# Patient Record
Sex: Female | Born: 1995 | Race: Black or African American | Hispanic: No | Marital: Single | Smoking: Never smoker
Health system: Southern US, Community
[De-identification: ages and names within clinical notes are randomized; demographics above are authoritative.]

## PROBLEM LIST (undated history)

## (undated) DIAGNOSIS — N83209 Unspecified ovarian cyst, unspecified side: Secondary | ICD-10-CM

## (undated) HISTORY — PX: TONSILLECTOMY: SUR1361

---

## 2017-01-13 ENCOUNTER — Emergency Department (HOSPITAL_COMMUNITY)
Admission: EM | Admit: 2017-01-13 | Discharge: 2017-01-13 | Disposition: A | Discharge status: Home / Self Care | Payer: BLUE CROSS/BLUE SHIELD | Attending: Emergency Medicine | Admitting: Emergency Medicine

## 2017-01-13 ENCOUNTER — Emergency Department (HOSPITAL_COMMUNITY): Payer: BLUE CROSS/BLUE SHIELD

## 2017-01-13 ENCOUNTER — Encounter (HOSPITAL_COMMUNITY): Payer: Self-pay | Admitting: *Deleted

## 2017-01-13 DIAGNOSIS — N83201 Unspecified ovarian cyst, right side: Secondary | ICD-10-CM | POA: Diagnosis not present

## 2017-01-13 DIAGNOSIS — Z79899 Other long term (current) drug therapy: Secondary | ICD-10-CM | POA: Insufficient documentation

## 2017-01-13 DIAGNOSIS — R1031 Right lower quadrant pain: Secondary | ICD-10-CM | POA: Diagnosis present

## 2017-01-13 LAB — COMPREHENSIVE METABOLIC PANEL
ALBUMIN: 3.7 g/dL (ref 3.5–5.0)
ALK PHOS: 46 U/L (ref 38–126)
ALT: 18 U/L (ref 14–54)
AST: 18 U/L (ref 15–41)
Anion gap: 8 (ref 5–15)
CALCIUM: 9.2 mg/dL (ref 8.9–10.3)
CHLORIDE: 104 mmol/L (ref 101–111)
CO2: 23 mmol/L (ref 22–32)
CREATININE: 0.79 mg/dL (ref 0.44–1.00)
GFR calc non Af Amer: >60 mL/min (ref >60)
GLUCOSE: 97 mg/dL (ref 65–99)
Potassium: 4.1 mmol/L (ref 3.5–5.1)
SODIUM: 135 mmol/L (ref 135–145)
Total Bilirubin: 0.7 mg/dL (ref 0.3–1.2)
Total Protein: 6.7 g/dL (ref 6.5–8.1)

## 2017-01-13 LAB — CBC
HCT: 36.6 % (ref 36.0–46.0)
Hemoglobin: 12.3 g/dL (ref 12.0–15.0)
MCH: 28.5 pg (ref 26.0–34.0)
MCHC: 33.6 g/dL (ref 30.0–36.0)
MCV: 84.9 fL (ref 78.0–100.0)
PLATELETS: 268 10*3/uL (ref 150–400)
RBC: 4.31 MIL/uL (ref 3.87–5.11)
RDW: 13.7 % (ref 11.5–15.5)
WBC: 7.4 10*3/uL (ref 4.0–10.5)

## 2017-01-13 LAB — URINALYSIS, ROUTINE W REFLEX MICROSCOPIC
BILIRUBIN URINE: NEGATIVE
GLUCOSE, UA: NEGATIVE mg/dL
HGB URINE DIPSTICK: NEGATIVE
Ketones, ur: NEGATIVE mg/dL
Leukocytes, UA: NEGATIVE
Nitrite: NEGATIVE
PROTEIN: NEGATIVE mg/dL
Specific Gravity, Urine: 1.017 (ref 1.005–1.030)
pH: 6 (ref 5.0–8.0)

## 2017-01-13 LAB — POC URINE PREG, ED: Preg Test, Ur: NEGATIVE

## 2017-01-13 LAB — LIPASE, BLOOD: LIPASE: 16 U/L (ref 11–51)

## 2017-01-13 NOTE — ED Provider Notes (Signed)
[facility]-EMERGENCY DEPT Provider Note   CSN: [id number] Arrival date & time: 01/13/17  [id number]     History   Chief Complaint Chief Complaint  Patient presents with  . Abdominal Pain    HPI Anna Johnston is a 21 y.o. female diagnoses past medical history presents today for worsening right lower quadrant abdominal pain for one week. Patient reports pain mostly when she defecates that radiates to her back has been increasing for the past week. She states that the pain is about a 5/10 pain on defecation otherwise she does "not feel much of pain." She states that she thought her symptoms might be gas at first tried Gas-X with little to no relief. Patient reports going to the urgent care yesterday stating that she had a urinalysis done and they stated that her urine was "clean" but that she should come to the emergency department to investigate for appendicitis. Patient reports LMP 12/14/2017 and recently starting a new birth control. Patient denies any history of STI states her last time she had intercourse was 3 months ago. Patient denies fevers, chills, nausea, vomiting, vaginal pain, vaginal bleeding, vaginal discharge, history of STI, chest pain, shortness of breath, changes in bowel movements, no blood in stools, no constipation, changes in urinary symptoms, dysuria, frequency, or changes in appetite.  The history is provided by the patient. No language interpreter was used.    History reviewed. No pertinent past medical history.  There are no active problems to display for this patient.   Past Surgical History:  Procedure Laterality Date  . TONSILLECTOMY      OB History    No data available       Home Medications    Prior to Admission medications   Medication Sig Start Date End Date Taking? Authorizing Provider  bismuth subsalicylate (PEPTO BISMOL) 262 MG/15ML suspension Take 30 mLs by mouth every 6 (six) hours as needed for indigestion.   Yes Historical Provider, MD    ibuprofen (ADVIL,MOTRIN) 200 MG tablet Take 200 mg by mouth every 6 (six) hours as needed for mild pain.   Yes Historical Provider, MD  simethicone (MYLICON) 80 MG chewable tablet Chew 80 mg by mouth every 6 (six) hours as needed for flatulence.   Yes Historical Provider, MD  NIKKI 3-0.02 MG tablet Take 1 tablet by mouth daily. 12/22/16   Historical Provider, MD    Family History History reviewed. No pertinent family history.  Social History Social History  Substance Use Topics  . Smoking status: Never Smoker  . Smokeless tobacco: Never Used  . Alcohol use No     Allergies   Patient has no known allergies.   Review of Systems Review of Systems  Constitutional: Negative for appetite change, chills and fever.  HENT: Negative for trouble swallowing.   Respiratory: Negative for shortness of breath.   Cardiovascular: Negative for chest pain.  Gastrointestinal: Positive for abdominal pain. Negative for abdominal distention, constipation, diarrhea, nausea and vomiting.  Genitourinary: Negative for difficulty urinating, dysuria, vaginal bleeding, vaginal discharge and vaginal pain.  Musculoskeletal: Positive for back pain. Negative for neck pain and neck stiffness.  Skin: Negative for rash and wound.  Neurological: Negative for numbness.     Physical Exam Updated Vital Signs BP 104/92 (BP Location: Right Arm)   Pulse 85   Temp 98.8 F (37.1 C) (Oral)   Resp 18   Ht 5\' 2"  (1.575 m)   Wt 79.4 kg   LMP 12/14/2016   SpO2 100%  BMI 32.01 kg/m   Physical Exam  Constitutional: She is oriented to person, place, and time. She appears well-developed and well-nourished.  Well-appearing  HENT:  Head: Normocephalic and atraumatic.  Nose: Nose normal.  Eyes: Conjunctivae and EOM are normal. Pupils are equal, round, and reactive to light.  Neck: Normal range of motion. Neck supple.  Cardiovascular: Normal rate and normal heart sounds.   Pulmonary/Chest: Effort normal and breath  sounds normal. No respiratory distress. She exhibits no tenderness.  Abdominal: Soft. Bowel sounds are normal. There is no rebound and no guarding.  Mild tenderness to lower right quadrant. Maximal tenderness at the right suprapubic area  Musculoskeletal: Normal range of motion.  Neurological: She is alert and oriented to person, place, and time.  Skin: Skin is warm. Capillary refill takes less than 2 seconds.  Psychiatric: She has a normal mood and affect. Her behavior is normal.  Nursing note and vitals reviewed.    ED Treatments / Results  Labs (all labs ordered are listed, but only abnormal results are displayed) Labs Reviewed  COMPREHENSIVE METABOLIC PANEL - Abnormal; Notable for the following:       Result Value   BUN <5 (*)    All other components within normal limits  LIPASE, BLOOD  CBC  URINALYSIS, ROUTINE W REFLEX MICROSCOPIC  POC URINE PREG, ED    EKG  EKG Interpretation None       Radiology US Transvaginal Non-ob  Result Date: 01/13/2017 CLINICAL DATA:  Right lower quadrant pain for 1 week. EXAM: TRANSABDOMINAL AND TRANSVAGINAL ULTRASOUND OF PELVIS DOPPLER ULTRASOUND OF OVARIES TECHNIQUE: Both transabdominal and transvaginal ultrasound examinations of the pelvis were performed. Transabdominal technique was performed for global imaging of the pelvis including uterus, ovaries, adnexal regions, and pelvic cul-de-sac. It was necessary to proceed with endovaginal exam following the transabdominal exam to visualize the adnexal structures to an adequate degree. Color and duplex Doppler ultrasound was utilized to evaluate blood flow to the ovaries. COMPARISON:  None. FINDINGS: Uterus Measurements: 5.7 x 3.3 x 4 cm. No fibroids or other mass visualized. Endometrium Thickness: 5 mm.  No focal abnormality visualized. Right ovary Measurements: 5.9 x 5.2 x 5.9 cm. Contains a simple appearing cyst measuring 5.1 x 5.1 x 5 cm. Blood flow is shown within the surrounding ovarian  parenchyma. No mass or free fluid seen within the adjacent right adnexal region. Left ovary Measurements: 3 x 1.1 x 3 cm. Normal appearance/no adnexal mass. Pulsed Doppler evaluation of both ovaries demonstrates normal low-resistance arterial and venous waveforms. Other findings No abnormal free fluid. IMPRESSION: 1. Simple appearing cyst within the right ovary measuring 5.1 x 5 cm. Blood flow is shown within the surrounding right ovarian parenchyma. No mass or free fluid within the adjacent right adnexal region. Please be aware that at an ovarian cyst of this size may not resolve on its own and does increase risk for future ovarian torsion. Consider GYN consultation for follow-up and/or therapeutic considerations. 2. Uterus and left ovary appear normal. Electronically Signed   By: Bary Richard M.D.   On: 01/13/2017 14:56   US Pelvis Complete  Result Date: 01/13/2017 CLINICAL DATA:  Right lower quadrant pain for 1 week. EXAM: TRANSABDOMINAL AND TRANSVAGINAL ULTRASOUND OF PELVIS DOPPLER ULTRASOUND OF OVARIES TECHNIQUE: Both transabdominal and transvaginal ultrasound examinations of the pelvis were performed. Transabdominal technique was performed for global imaging of the pelvis including uterus, ovaries, adnexal regions, and pelvic cul-de-sac. It was necessary to proceed with endovaginal exam following the transabdominal  exam to visualize the adnexal structures to an adequate degree. Color and duplex Doppler ultrasound was utilized to evaluate blood flow to the ovaries. COMPARISON:  None. FINDINGS: Uterus Measurements: 5.7 x 3.3 x 4 cm. No fibroids or other mass visualized. Endometrium Thickness: 5 mm.  No focal abnormality visualized. Right ovary Measurements: 5.9 x 5.2 x 5.9 cm. Contains a simple appearing cyst measuring 5.1 x 5.1 x 5 cm. Blood flow is shown within the surrounding ovarian parenchyma. No mass or free fluid seen within the adjacent right adnexal region. Left ovary Measurements: 3 x 1.1 x 3 cm.  Normal appearance/no adnexal mass. Pulsed Doppler evaluation of both ovaries demonstrates normal low-resistance arterial and venous waveforms. Other findings No abnormal free fluid. IMPRESSION: 1. Simple appearing cyst within the right ovary measuring 5.1 x 5 cm. Blood flow is shown within the surrounding right ovarian parenchyma. No mass or free fluid within the adjacent right adnexal region. Please be aware that at an ovarian cyst of this size may not resolve on its own and does increase risk for future ovarian torsion. Consider GYN consultation for follow-up and/or therapeutic considerations. 2. Uterus and left ovary appear normal. Electronically Signed   By: Bary Richard M.D.   On: 01/13/2017 14:56   [place] Art/ven Flow Abd Pelv Doppler  Result Date: 01/13/2017 CLINICAL DATA:  Right lower quadrant pain for 1 week. EXAM: TRANSABDOMINAL AND TRANSVAGINAL ULTRASOUND OF PELVIS DOPPLER ULTRASOUND OF OVARIES TECHNIQUE: Both transabdominal and transvaginal ultrasound examinations of the pelvis were performed. Transabdominal technique was performed for global imaging of the pelvis including uterus, ovaries, adnexal regions, and pelvic cul-de-sac. It was necessary to proceed with endovaginal exam following the transabdominal exam to visualize the adnexal structures to an adequate degree. Color and duplex Doppler ultrasound was utilized to evaluate blood flow to the ovaries. COMPARISON:  None. FINDINGS: Uterus Measurements: 5.7 x 3.3 x 4 cm. No fibroids or other mass visualized. Endometrium Thickness: 5 mm.  No focal abnormality visualized. Right ovary Measurements: 5.9 x 5.2 x 5.9 cm. Contains a simple appearing cyst measuring 5.1 x 5.1 x 5 cm. Blood flow is shown within the surrounding ovarian parenchyma. No mass or free fluid seen within the adjacent right adnexal region. Left ovary Measurements: 3 x 1.1 x 3 cm. Normal appearance/no adnexal mass. Pulsed Doppler evaluation of both ovaries demonstrates normal  low-resistance arterial and venous waveforms. Other findings No abnormal free fluid. IMPRESSION: 1. Simple appearing cyst within the right ovary measuring 5.1 x 5 cm. Blood flow is shown within the surrounding right ovarian parenchyma. No mass or free fluid within the adjacent right adnexal region. Please be aware that at an ovarian cyst of this size may not resolve on its own and does increase risk for future ovarian torsion. Consider GYN consultation for follow-up and/or therapeutic considerations. 2. Uterus and left ovary appear normal. Electronically Signed   By: Bary Richard M.D.   On: 01/13/2017 14:56    Procedures Procedures (including critical care time)  Medications Ordered in ED Medications - No data to display   Initial Impression / Assessment and Plan / ED Course  I have reviewed the triage vital signs and the nursing notes.  Pertinent labs & imaging results that were available during my care of the patient were reviewed by me and considered in my medical decision making (see chart for details).  Clinical Course   Pt is a 21 yo female with no significant PMhx who presents with RLQ pain x 1 week.  On exam, pt in NAD. Nontoxic/nonseptic appearing. VSS. Afebrile. Lungs CTA. Heart RRR. Abdomen soft and mildly tender at [facility], maximally tender at right suprapubic area. Labs and UA are reassuring. Imaging shows new right ovarian cyst 5.1 x 5cm. Plan is to discharge home with strict instructions to follow up today or tomorrow with her gynecologist regarding today's findings. Pt advised that the size of her ovarian cyst makes her a higher risk of ovarian torsion. Pt understood urgency for follow up to GYN. At time of discharge, Patient is in no acute distress. Vital Signs are stable. Patient is able to ambulate. Return precautions given for any new or worsening symptoms such as severe pain in RLQ, fever, chills.    Final Clinical Impressions(s) / ED Diagnoses   Final diagnoses:  Cyst of right  ovary    New Prescriptions Discharge Medication List as of 01/13/2017  3:23 PM       [address] [place], [place] 01/13/17 1957    Laurence Spates, MD 01/25/17 [phone]

## 2017-01-13 NOTE — ED Notes (Signed)
Patient transported to us 

## 2017-01-13 NOTE — ED Notes (Signed)
Returned from Us.

## 2017-01-13 NOTE — Discharge Instructions (Signed)
Please schedule appointment with your gynecologist today or tomorrow for follow up regarding today's results.  Simple appearing cyst within the right ovary measuring 5.1 x 5  cm. Blood flow is shown within the surrounding right ovarian  parenchyma. No mass or free fluid within the adjacent right adnexal  region. Please be aware that at an ovarian cyst of this size may not  resolve on its own and does increase risk for future ovarian  torsion. Consider GYN consultation for follow-up and/or therapeutic  considerations.   Get help right away if: You have abdominal pain that is severe or gets worse. You cannot eat or drink without vomiting. You suddenly develop a fever. Your menstrual period is much heavier than usual.

## 2017-01-13 NOTE — ED Notes (Signed)
Patient aware she is waiting on u/s

## 2017-10-06 ENCOUNTER — Emergency Department (HOSPITAL_COMMUNITY)
Admission: EM | Admit: 2017-10-06 | Discharge: 2017-10-06 | Disposition: A | Discharge status: Home / Self Care | Payer: BLUE CROSS/BLUE SHIELD | Attending: Emergency Medicine | Admitting: Emergency Medicine

## 2017-10-06 ENCOUNTER — Encounter (HOSPITAL_COMMUNITY): Payer: Self-pay

## 2017-10-06 ENCOUNTER — Emergency Department (HOSPITAL_COMMUNITY): Payer: BLUE CROSS/BLUE SHIELD

## 2017-10-06 DIAGNOSIS — Z79899 Other long term (current) drug therapy: Secondary | ICD-10-CM | POA: Diagnosis not present

## 2017-10-06 DIAGNOSIS — X500XXA Overexertion from strenuous movement or load, initial encounter: Secondary | ICD-10-CM | POA: Diagnosis not present

## 2017-10-06 DIAGNOSIS — Y999 Unspecified external cause status: Secondary | ICD-10-CM | POA: Diagnosis not present

## 2017-10-06 DIAGNOSIS — Y9389 Activity, other specified: Secondary | ICD-10-CM | POA: Diagnosis not present

## 2017-10-06 DIAGNOSIS — Y929 Unspecified place or not applicable: Secondary | ICD-10-CM | POA: Diagnosis not present

## 2017-10-06 DIAGNOSIS — S46911A Strain of unspecified muscle, fascia and tendon at shoulder and upper arm level, right arm, initial encounter: Secondary | ICD-10-CM

## 2017-10-06 DIAGNOSIS — S4991XA Unspecified injury of right shoulder and upper arm, initial encounter: Secondary | ICD-10-CM | POA: Diagnosis present

## 2017-10-06 HISTORY — DX: Unspecified ovarian cyst, unspecified side: N83.209

## 2017-10-06 MED ORDER — CYCLOBENZAPRINE HCL 10 MG PO TABS: ORAL_TABLET | ORAL | 0 refills | Status: AC

## 2017-10-06 MED ORDER — IBUPROFEN 600 MG PO TABS: 600.0000 mg | ORAL_TABLET | Freq: Four times a day (QID) | ORAL | 0 refills | Status: AC

## 2017-10-06 NOTE — ED Provider Notes (Signed)
[facility]-EMERGENCY DEPT Provider Note   CSN: [id number] Arrival date & time: 10/06/17  1430     History   Chief Complaint Chief Complaint  Patient presents with  . Shoulder Pain    HPI Johnston Anna is a 21 y.o. female.  The history is provided by the patient.  Shoulder Pain   This is a new problem. The current episode started more than 1 week ago. The problem occurs daily. The problem has been gradually worsening. The pain is present in the right shoulder. The quality of the pain is described as sharp and intermittent. The pain is moderate. Pertinent negatives include no numbness and no tingling. Exacerbated by: movement and lifting. She has tried OTC pain medications for the symptoms. The treatment provided mild relief.    Past Medical History:  Diagnosis Date  . Ovarian cyst     There are no active problems to display for this patient.   Past Surgical History:  Procedure Laterality Date  . TONSILLECTOMY      OB History    No data available       Home Medications    Prior to Admission medications   Medication Sig Start Date End Date Taking? Authorizing Provider  bismuth subsalicylate (PEPTO BISMOL) 262 MG/15ML suspension Take 30 mLs by mouth every 6 (six) hours as needed for indigestion.    [provider]  ibuprofen (ADVIL,MOTRIN) 200 MG tablet Take 200 mg by mouth every 6 (six) hours as needed for mild pain.    [provider]  Anna Johnston 3-0.02 MG tablet Take 1 tablet by mouth daily. 12/22/16   [provider]  simethicone (MYLICON) 80 MG chewable tablet Chew 80 mg by mouth every 6 (six) hours as needed for flatulence.    [provider]    Family History No family history on file.  Social History Social History  Substance Use Topics  . Smoking status: Never Smoker  . Smokeless tobacco: Never Used  . Alcohol use No     Allergies   Patient has no known allergies.   Review of Systems Review of Systems    Constitutional: Negative for activity change.       All ROS Neg except as noted in HPI  HENT: Negative for nosebleeds.   Eyes: Negative for photophobia and discharge.  Respiratory: Negative for cough, shortness of breath and wheezing.   Cardiovascular: Negative for chest pain and palpitations.  Gastrointestinal: Negative for abdominal pain and blood in stool.  Genitourinary: Negative for dysuria, frequency and hematuria.  Musculoskeletal: Positive for arthralgias. Negative for back pain and neck pain.  Skin: Negative.   Neurological: Negative for dizziness, tingling, seizures, speech difficulty and numbness.  Psychiatric/Behavioral: Negative for confusion and hallucinations.     Physical Exam Updated Vital Signs BP (!) 143/96 (BP Location: Right Arm)   Pulse 95   Temp 98.3 F (36.8 C) (Oral)   Resp 18   Ht  (1.575 m)   Wt 80.3 kg (177 lb)   LMP 10/01/2017   SpO2 97%   BMI 32.37 kg/m   Physical Exam  Constitutional: She is oriented to person, place, and time. She appears well-developed and well-nourished.  Non-toxic appearance.  HENT:  Head: Normocephalic.  Right Ear: Tympanic membrane and external ear normal.  Left Ear: Tympanic membrane and external ear normal.  Eyes: Pupils are equal, round, and reactive to light. EOM and lids are normal.  Neck: Normal range of motion. Neck supple. Carotid bruit is not present.  Cardiovascular: Normal rate, regular rhythm, normal heart sounds, intact distal pulses and normal pulses.   Pulmonary/Chest: Breath sounds normal. No respiratory distress.  Abdominal: Soft. Bowel sounds are normal. There is no tenderness. There is no guarding.  Musculoskeletal: Normal range of motion.       Right shoulder: She exhibits pain and spasm. She exhibits normal range of motion.       Arms: Lymphadenopathy:       Head (right side): No submandibular adenopathy present.       Head (left side): No submandibular adenopathy present.    She has no  cervical adenopathy.  Neurological: She is alert and oriented to person, place, and time. She has normal strength. No cranial nerve deficit or sensory deficit.  Skin: Skin is warm and dry.  Psychiatric: She has a normal mood and affect. Her speech is normal.  Nursing note and vitals reviewed.    ED Treatments / Results  Labs (all labs ordered are listed, but only abnormal results are displayed) Labs Reviewed - No data to display  EKG  EKG Interpretation None       Radiology Dg Shoulder Right  Result Date: 10/06/2017 CLINICAL DATA:  21 year old female with history of right shoulder pain for the past 2 weeks. EXAM: RIGHT SHOULDER - 2+ VIEW COMPARISON:  No priors. FINDINGS: There is no evidence of fracture or dislocation. There is no evidence of arthropathy or other focal bone abnormality. Soft tissues are unremarkable. IMPRESSION: Negative. Electronically Signed   By: Trudie Reed M.D.   On: 10/06/2017 14:59    Procedures Procedures (including critical care time)  Medications Ordered in ED Medications - No data to display   Initial Impression / Assessment and Plan / ED Course  I have reviewed the triage vital signs and the nursing notes.  Pertinent labs & imaging results that were available during my care of the patient were reviewed by me and considered in my medical decision making (see chart for details).       Final Clinical Impressions(s) / ED Diagnoses MDM Vital signs reviewed. There no gross neurovascular deficits appreciated of the upper extremities. X-ray is negative for fracture or dislocation. I suspect the patient has a muscle strain involving the right shoulder area. I've asked the patient use a heating pad. Prescription given for Flexeril to be using at bedtime. Patient will use ibuprofen with breakfast, lunch, dinner, and at bedtime. Patient will return to the emergency department if any changes, problems, or concerns.   Final diagnoses:  Strain of  right shoulder, initial encounter    New Prescriptions New Prescriptions   No medications on file     Ivery Quale, Cordelia Poche 10/06/17 1539    Benjiman Core, MD 10/06/17 [phone]

## 2017-10-06 NOTE — ED Notes (Signed)
Pt alert & oriented x4, stable gait. Patient given discharge instructions, paperwork & prescription(s). Patient  instructed to stop at the registration desk to finish any additional paperwork. Patient verbalized understanding. Pt left department w/ no further questions. 

## 2017-10-06 NOTE — ED Notes (Signed)
Pain in right shoulder for the past 2 weeks. Pt states started after moving a sofa. No deformity noted. Pt states pain when she raises her arm & when she moves it across her body. Pt has good pulses present & sensation.

## 2017-10-06 NOTE — ED Triage Notes (Signed)
Pt reports r shoulder pain x 2 weeks.  Pt says pain started after moving a couch.

## 2017-10-06 NOTE — Discharge Instructions (Signed)
Your x-ray is negative for fracture or dislocation. There no gross neurologic or vascular deficits appreciated on your examination at this time. Your examination favors muscle strain. Please rest your shoulder is much as possible. Use a heating pad at rest and at bedtime. Use ibuprofen with breakfast, lunch, dinner, and at bedtime. Use Flexeril at bedtime for spasm pain. This medication may cause drowsiness, please use it with caution.

## 2018-12-01 IMAGING — US US PELVIS COMPLETE
1 series · 13 of 25 positions shown · non-contrast
Comparison: None.

CLINICAL DATA: Right lower quadrant pain for 1 week.

EXAM:
TRANSABDOMINAL AND TRANSVAGINAL ULTRASOUND OF PELVIS
DOPPLER ULTRASOUND OF OVARIES
TECHNIQUE: Both transabdominal and transvaginal ultrasound examinations of the
pelvis were performed. Transabdominal technique was performed for
global imaging of the pelvis including uterus, ovaries, adnexal
regions, and pelvic cul-de-sac.
It was necessary to proceed with endovaginal exam following the
transabdominal exam to visualize the adnexal structures to an
adequate degree. Color and duplex Doppler ultrasound was utilized to
evaluate blood flow to the ovaries.

[Series 1: us pelvis complete · 0.21mm/px · 13 of 67 slices shown]
[im 1/67]
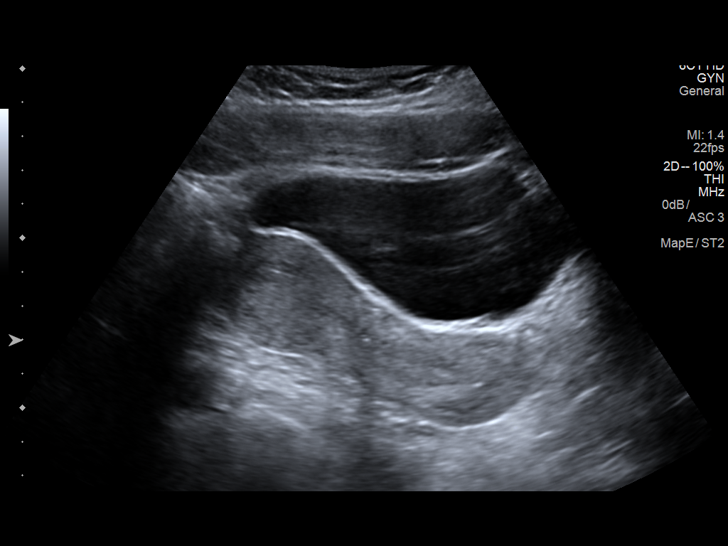
[im 6/67]
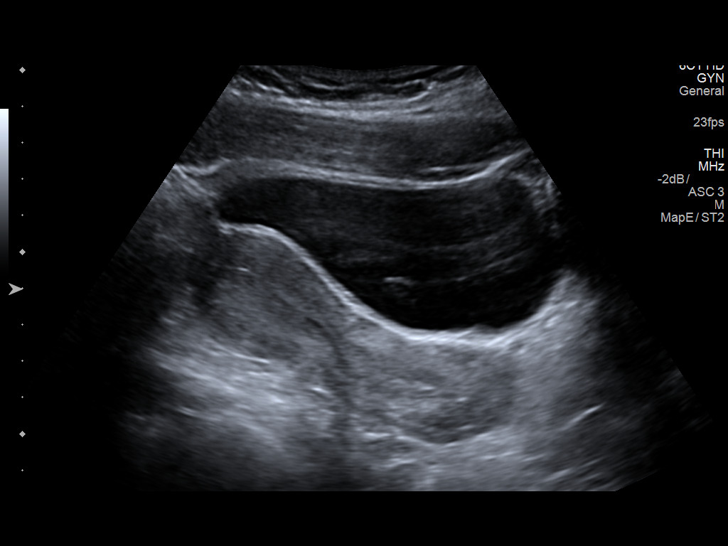
[im 12/67]
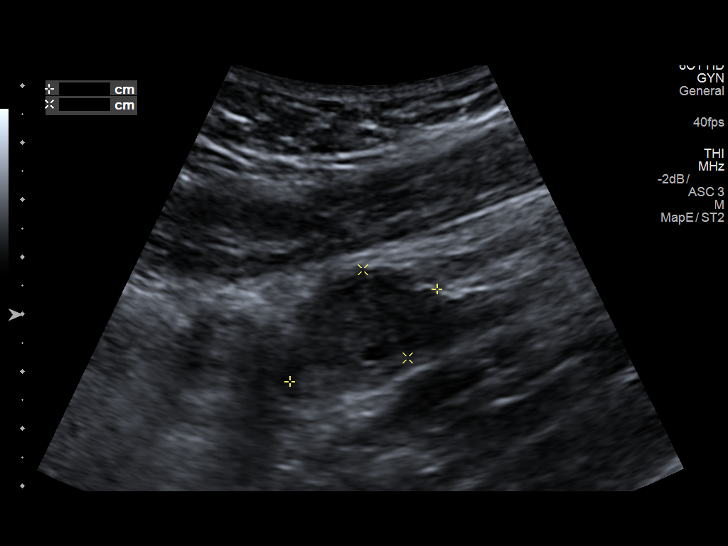
[im 17/67]
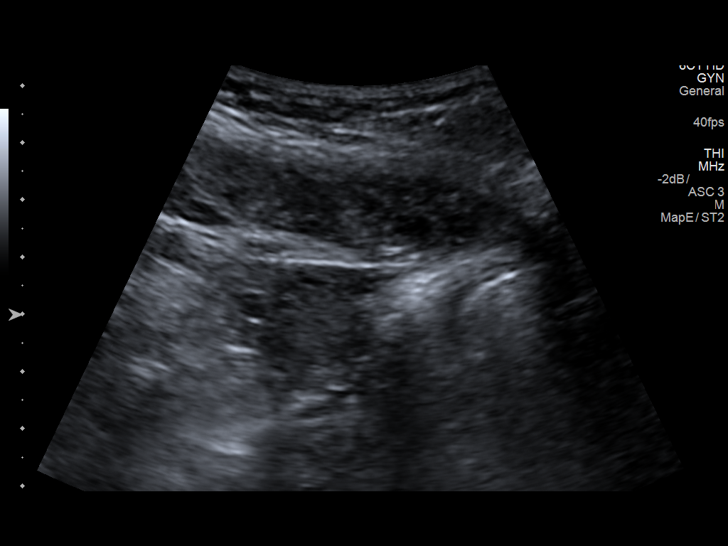
[im 23/67]
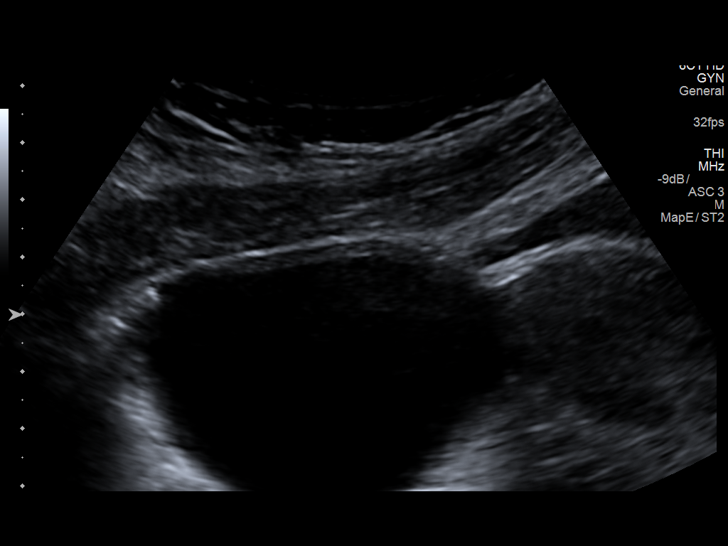
[im 28/67]
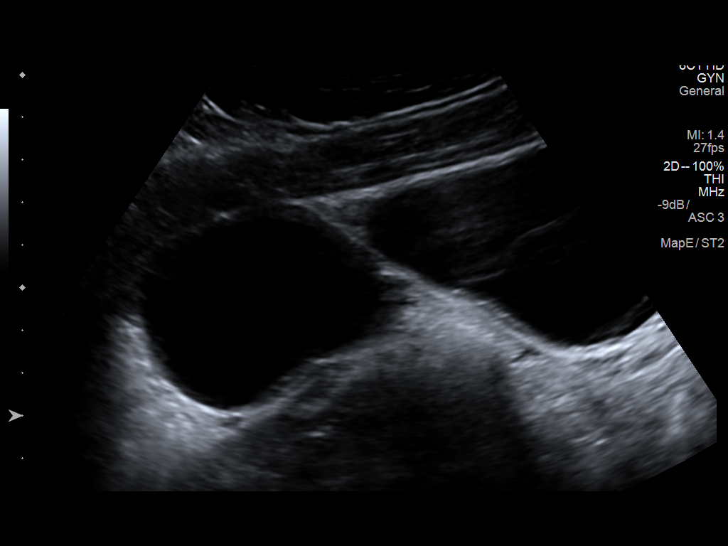
[im 34/67]
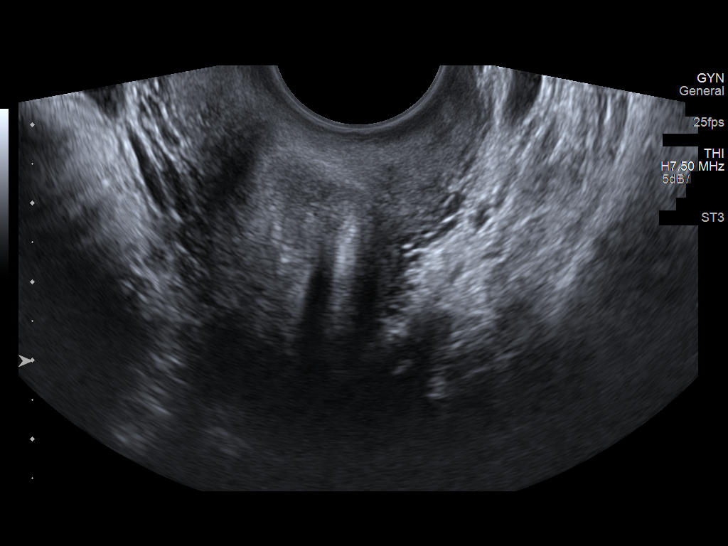
[im 39/67]
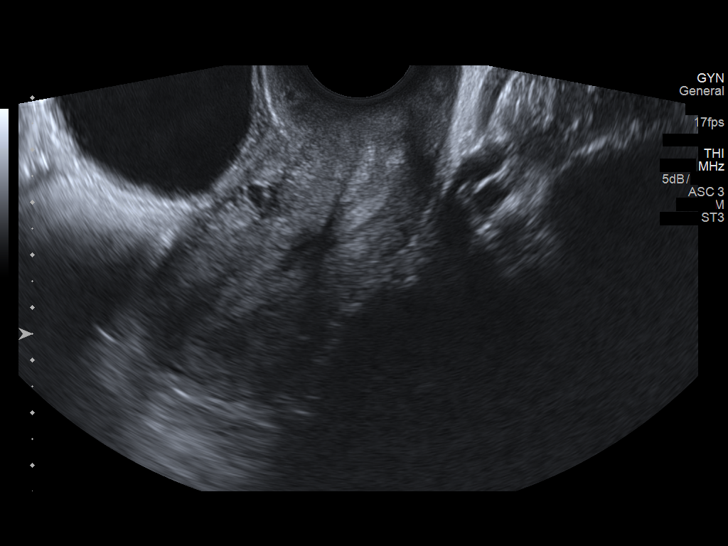
[im 45/67]
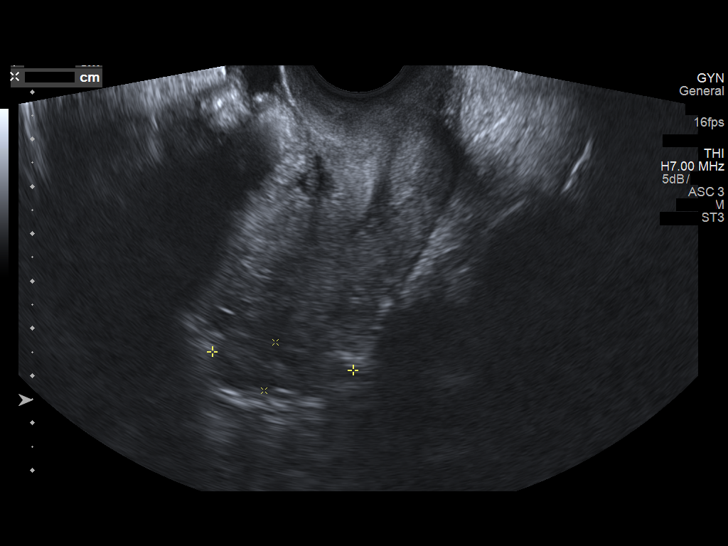
[im 50/67]
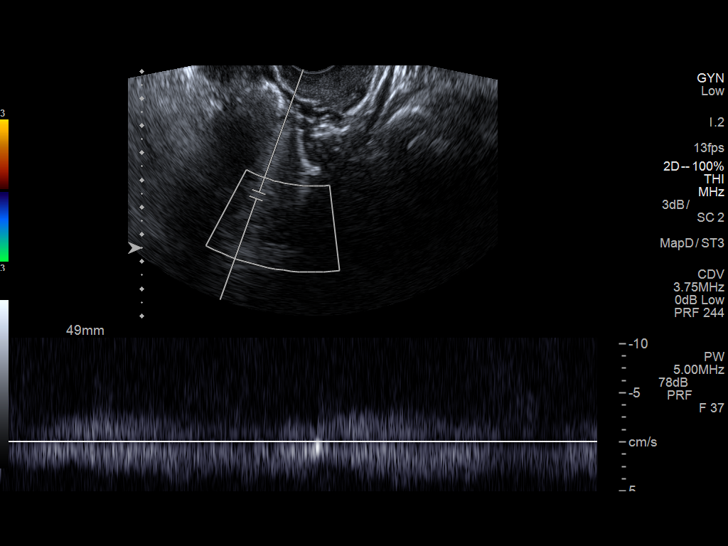
[im 56/67]
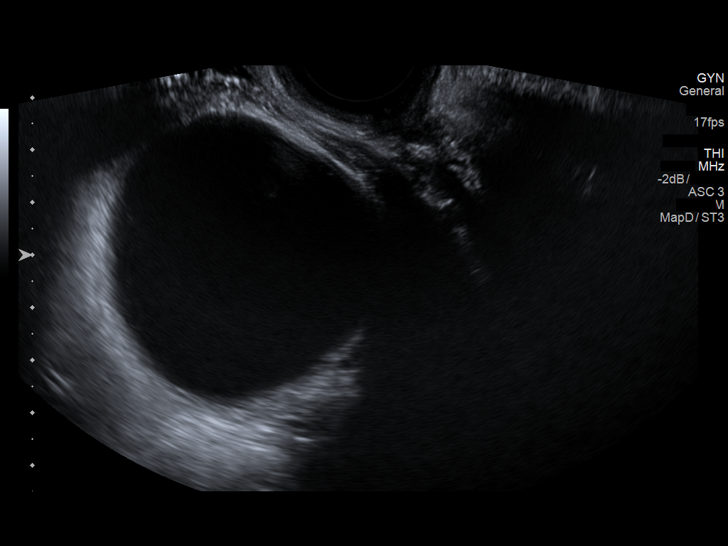
[im 61/67]
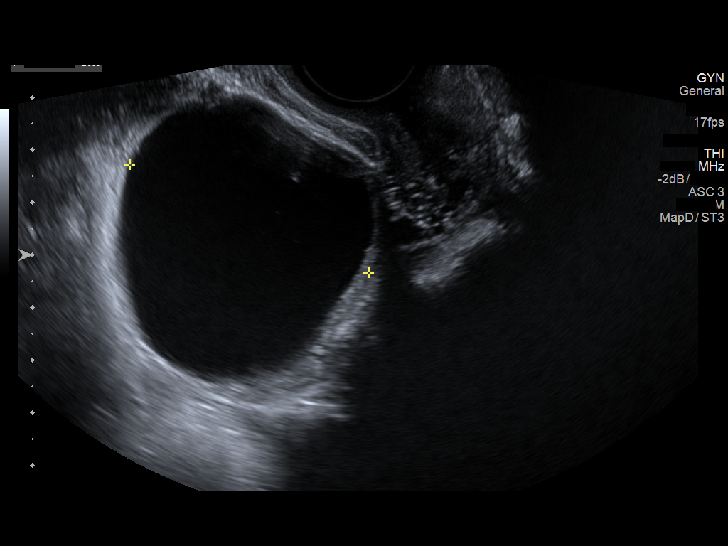
[im 67/67]
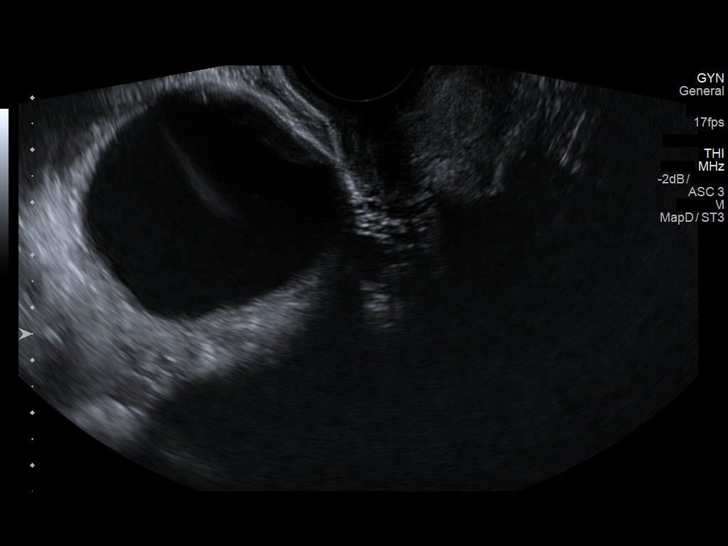

[13 of 25 positions shown; findings below may reference images not displayed]

FINDINGS: Uterus

Measurements: 5.7 x 3.3 x 4 cm. No fibroids or other mass
visualized.

Endometrium

Thickness: 5 mm.  No focal abnormality visualized.

Right ovary

Measurements: 5.9 x 5.2 x 5.9 cm.. Contains a simple appearing cyst
measuring 5.1 x 5.1 x 5 cm. Blood flow is shown within the
surrounding ovarian parenchyma. No mass or free fluid seen within
the adjacent right adnexal region.

Left ovary

Measurements: 3 x 1.1 x 3 cm. Normal appearance/no adnexal mass.

Pulsed Doppler evaluation of both ovaries demonstrates normal
low-resistance arterial and venous waveforms.

Other findings

No abnormal free fluid.
IMPRESSION: 1. Simple appearing cyst within the right ovary measuring 5.1 x 5
cm. Blood flow is shown within the surrounding right ovarian
parenchyma. No mass or free fluid within the adjacent right adnexal
region. Please be aware that at an ovarian cyst of this size may not
resolve on its own and does increase risk for future ovarian
torsion. Consider GYN consultation for follow-up and/or therapeutic
considerations.
2. Uterus and left ovary appear normal.

## 2019-03-08 IMAGING — DX DG SHOULDER 2+V*R*
3 series · 3 of 3 positions shown · non-contrast
Comparison: No priors.

CLINICAL DATA: 20-year-old female with history of right shoulder
pain for the past 2 weeks.

EXAM:
RIGHT SHOULDER - 2+ VIEW

[shoulder grashey]
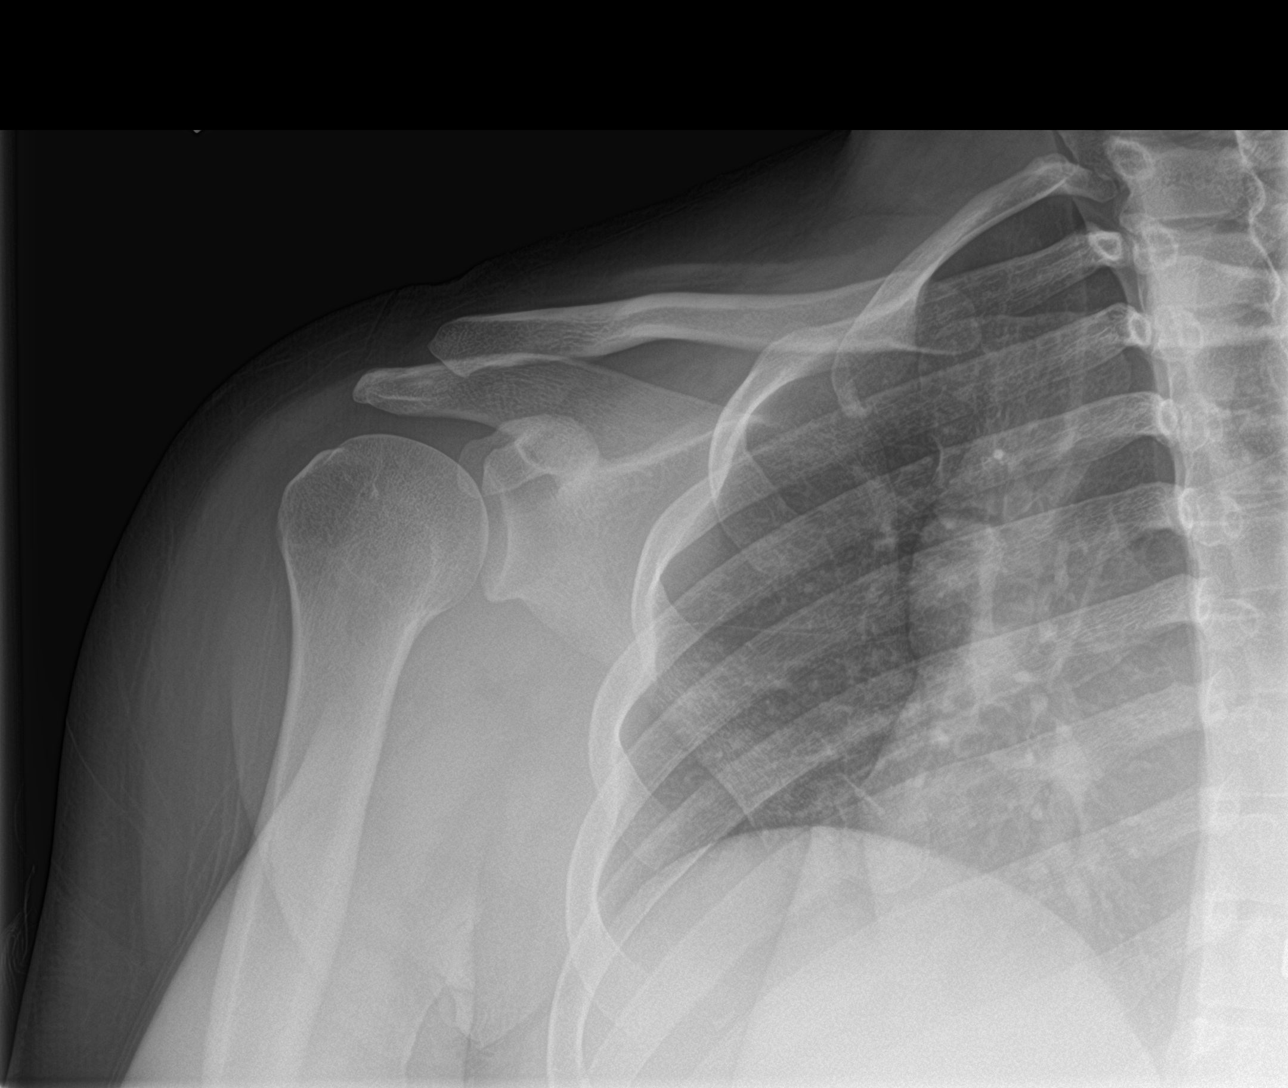

[shoulder y view]
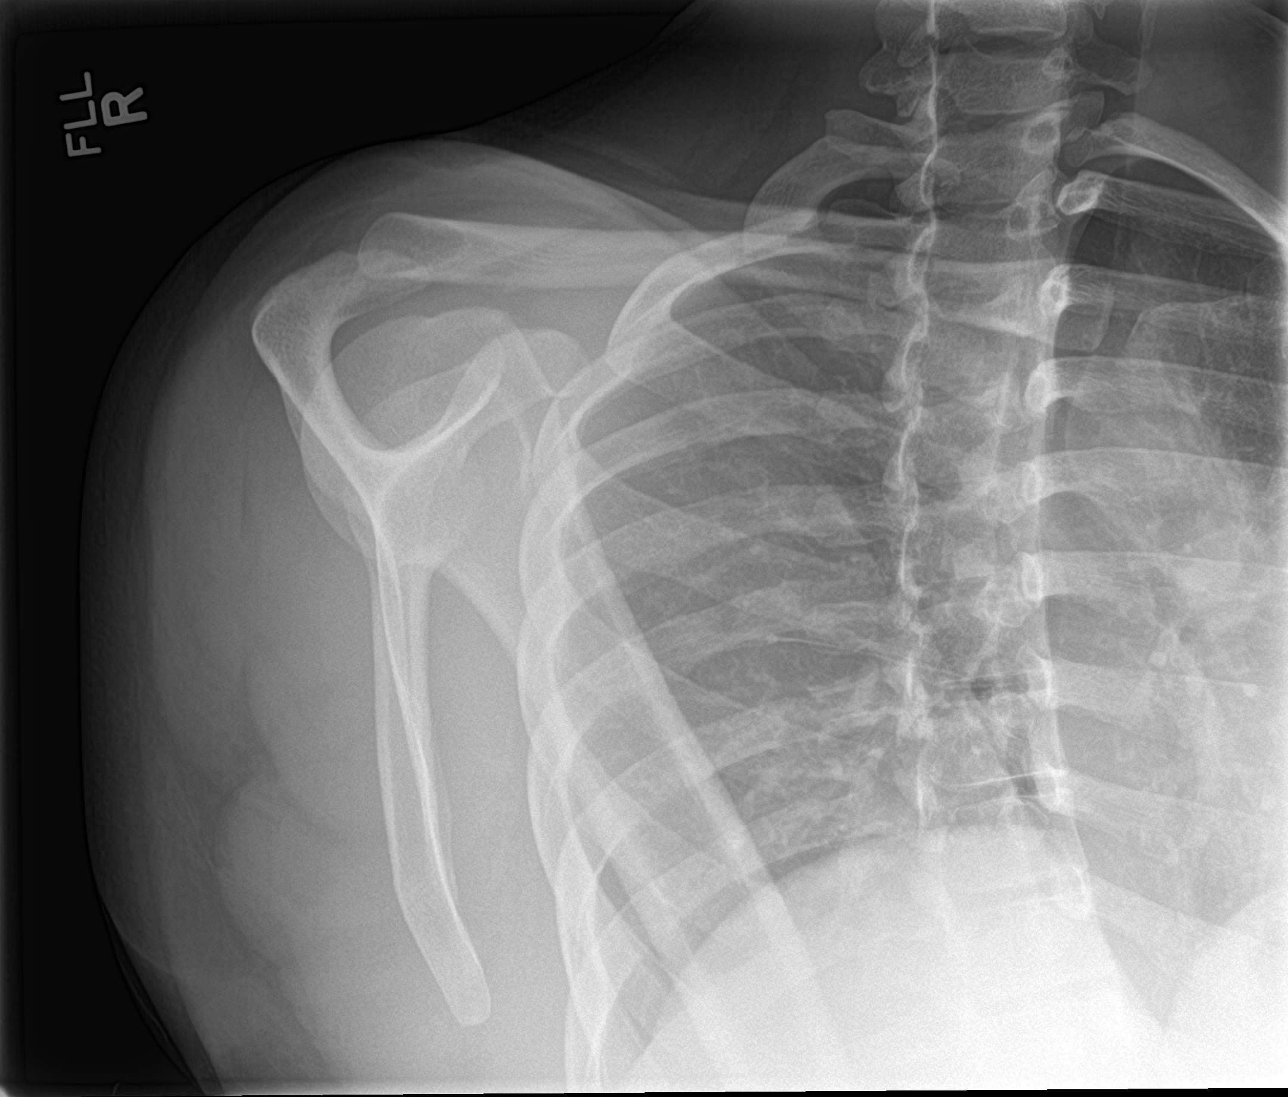

[shoulder axillary]
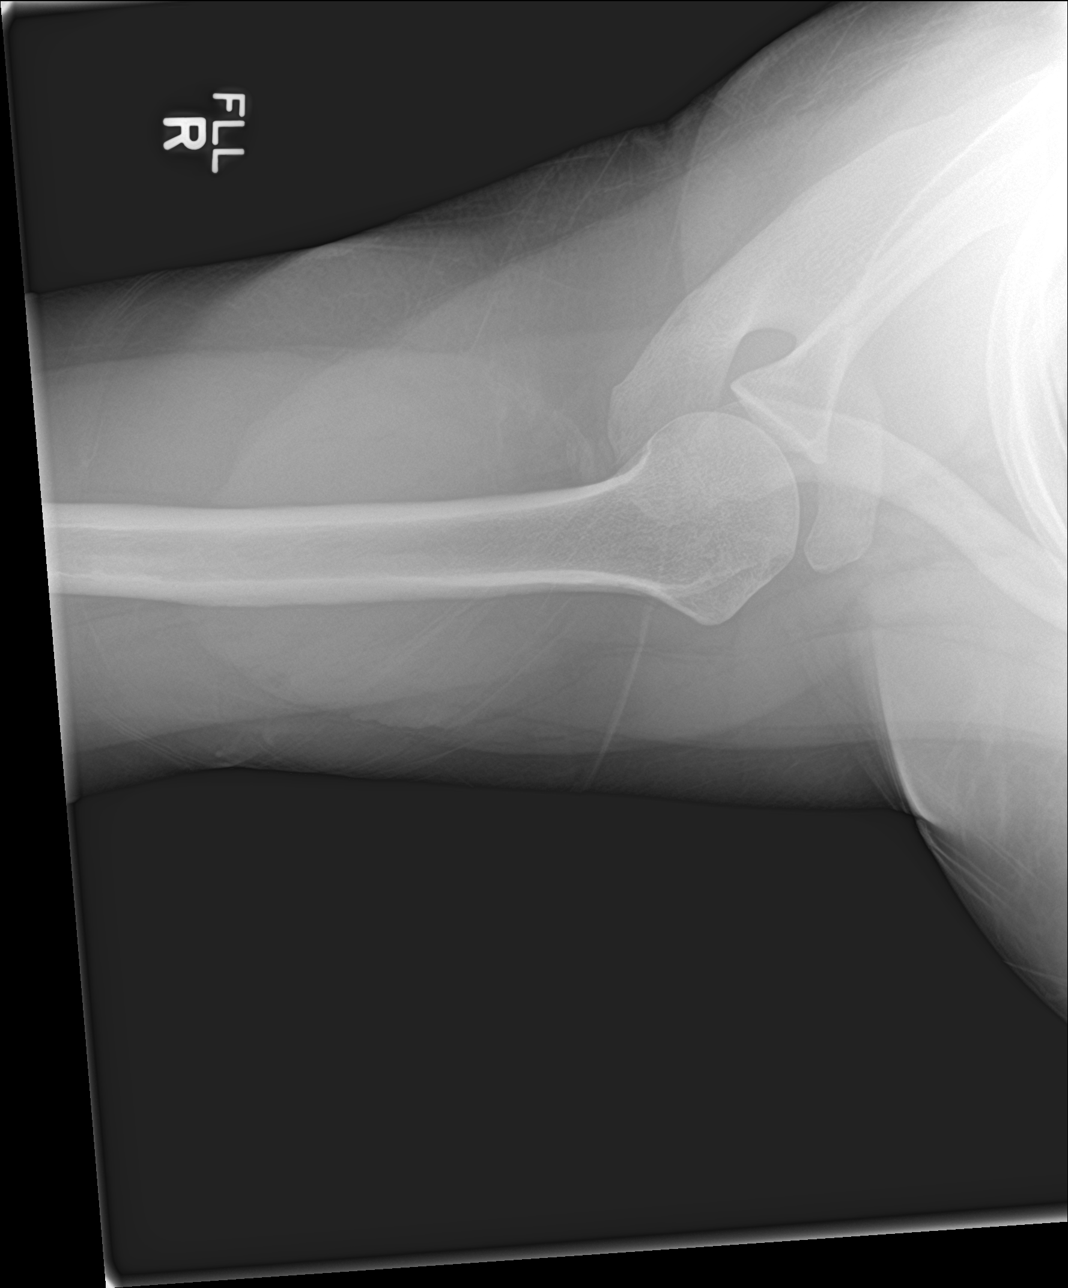

[3 of 3 positions shown; findings below may reference images not displayed]

FINDINGS: There is no evidence of fracture or dislocation. There is no
evidence of arthropathy or other focal bone abnormality. Soft
tissues are unremarkable.
IMPRESSION: Negative.

## 2020-02-25 ENCOUNTER — Ambulatory Visit: Payer: BLUE CROSS/BLUE SHIELD | Attending: Internal Medicine

## 2020-02-25 ENCOUNTER — Other Ambulatory Visit: Payer: Self-pay

## 2020-02-25 DIAGNOSIS — Z23 Encounter for immunization: Secondary | ICD-10-CM

## 2020-02-25 NOTE — Progress Notes (Signed)
   Covid-19 Vaccination Clinic  Name:  [name]    MRN: [id number] DOB: 1996/12/10  02/25/2020  [name] was observed post Covid-19 immunization for 15 minutes without incidence. She was provided with Vaccine Information Sheet and instruction to access the V-Safe system.   [name] was instructed to call [phone] with any severe reactions post vaccine: Marland Kitchen Difficulty breathing  . Swelling of your face and throat  . A fast heartbeat  . A bad rash all over your body  . Dizziness and weakness    Immunizations Administered    Name Date Dose VIS Date Route   Pfizer COVID-19 Vaccine 02/25/2020  8:55 AM 0.3 mL 12/08/2019 Intramuscular   Manufacturer: [employer], [employer]   Lot: [id number]   NDC: [address]-7

## 2020-02-26 NOTE — Progress Notes (Signed)
Patient received Moderna Vaccine Lot Number [id number] previously documented at [employer]  

## 2020-03-28 ENCOUNTER — Ambulatory Visit: Payer: Self-pay | Attending: Internal Medicine

## 2020-03-28 DIAGNOSIS — Z23 Encounter for immunization: Secondary | ICD-10-CM

## 2020-03-28 NOTE — Progress Notes (Signed)
   Covid-19 Vaccination Clinic  Name:  Anna Johnston    MRN: [id number] DOB: 03-17-1996  03/28/2020  Ms. Anna Johnston was observed post Covid-19 immunization for 15 minutes without incident. She was provided with Vaccine Information Sheet and instruction to access the V-Safe system.   Ms. Anna Johnston was instructed to call 911 with any severe reactions post vaccine: Marland Kitchen Difficulty breathing  . Swelling of face and throat  . A fast heartbeat  . A bad rash all over body  . Dizziness and weakness   Immunizations Administered    Name Date Dose VIS Date Route   Moderna COVID-19 Vaccine 03/28/2020  1:32 PM 0.5 mL 11/28/2019 Intramuscular   Manufacturer: Moderna   Lot: [id number]   NDC: 24469-507-22

## 2021-06-16 ENCOUNTER — Other Ambulatory Visit: Payer: Self-pay

## 2021-06-16 ENCOUNTER — Ambulatory Visit (HOSPITAL_COMMUNITY)
Admission: EM | Admit: 2021-06-16 | Discharge: 2021-06-16 | Disposition: A | Discharge status: Home / Self Care | Payer: 59

## 2021-06-16 DIAGNOSIS — F411 Generalized anxiety disorder: Secondary | ICD-10-CM | POA: Diagnosis not present

## 2021-06-16 NOTE — ED Notes (Signed)
Pt discharged in no acute distress. Resources given. Safety maintained. 

## 2021-06-16 NOTE — BH Assessment (Signed)
Triage Note- ROUTINE. Pt presents with reports of hx of panic d/o & currently considering possibility of ADHD dx. Pt has been studying abroad and now studying for [employer].

## 2021-06-16 NOTE — Discharge Instructions (Addendum)
Take all medications as prescribed. Keep all follow-up appointments as scheduled.  Do not consume alcohol or use illegal drugs while on prescription medications. Report any adverse effects from your medications to your primary care provider promptly.  In the event of recurrent symptoms or worsening symptoms, call 911, a crisis hotline, or go to the nearest emergency department for evaluation.   

## 2021-06-16 NOTE — ED Provider Notes (Signed)
[facility] Medical Screening Exam  Patient Name: Anna Johnston MRN: [id number] Date of Evaluation: 06/16/21 Chief Complaint:   Diagnosis:  Final diagnoses:  GAD (generalized anxiety disorder)    History of Present illness: Anna Johnston is a 25 y.o. female.  Presents to [facility] urgent care seeking additional outpatient resources for medication management due to ADHD and decreased ability to focus.   She denied suicidal or homicidal ideations.  Denies auditory or visual hallucinations.  States she has been studying abroad and was diagnosed with panic disorder a while ago . Stated that she was seeking help to follow-up for possible medications.  She denied that she has been followed by psychiatry in the past.  States she is currently followed by a therapist.  We will make additional outpatient resources available for outpatient service providers.  Patient to call insurance company for in network psychiatry.  patient reported her insurances advise follow-up with psychiatrist  Aguwal S. Patient was receptive to plan. Support, encouragement and reassurances was provided.   Psychiatric Specialty Exam  Presentation  General Appearance:Appropriate for Environment  Eye Contact:Good  Speech:Clear and Coherent  Speech Volume:Normal  Handedness:Right   Mood and Affect  Mood:Anxious  Affect:Congruent   Thought Process  Thought Processes:Coherent  Descriptions of Associations:Intact  Orientation:Full (Time, Place and Person)  Thought Content:Logical    Hallucinations:None  Ideas of Reference:None  Suicidal Thoughts:No  Homicidal Thoughts:No   Sensorium  Memory:Remote Good; Recent Good; Immediate Good  Judgment:Good  Insight:None   Executive Functions  Concentration:Good  Attention Span:Good  Recall:Good  Fund of Knowledge:Good  Language:Good   Psychomotor Activity  Psychomotor Activity:Normal   Assets  Assets:Social  Support   Sleep  Sleep:Good  Number of hours:  No data recorded  Nutritional Assessment (For OBS and [facility] admissions only) Has the patient had a weight loss or gain of 10 pounds or more in the last 3 months?: No Has the patient had a decrease in food intake/or appetite?: No Does the patient have dental problems?: No Does the patient have eating habits or behaviors that may be indicators of an eating disorder including binging or inducing vomiting?: No Has the patient recently lost weight without trying?: No Has the patient been eating poorly because of a decreased appetite?: No Malnutrition Screening Tool Score: 0   Physical Exam: Physical Exam Neurological:     Mental Status: She is alert.  Psychiatric:        Attention and Perception: Attention normal.        Mood and Affect: Mood normal.        Speech: Speech normal.        Behavior: Behavior normal. Behavior is cooperative.        Thought Content: Thought content normal.        Cognition and Memory: Cognition normal.        Judgment: Judgment normal.   Review of Systems  Cardiovascular: Negative.   Neurological: Negative.   Psychiatric/Behavioral:  Negative for depression. The patient is nervous/anxious.   All other systems reviewed and are negative. There were no vitals taken for this visit. There is no height or weight on file to calculate BMI.  Musculoskeletal: Strength & Muscle Tone: within normal limits Gait & Station: normal Patient leans: N/A   [facility] MSE Discharge Disposition for Follow up and Recommendations: Based on my evaluation the patient does not appear to have an emergency medical condition and can be discharged with resources and follow up care in outpatient  services for Medication Management, Partial Hospitalization Program, and Individual Therapy    Oneta Rack, NP 06/16/2021, 10:17 AM

## 2021-06-20 ENCOUNTER — Telehealth (HOSPITAL_COMMUNITY): Payer: Self-pay | Admitting: Psychiatry

## 2023-11-03 ENCOUNTER — Emergency Department (HOSPITAL_COMMUNITY)
Admission: EM | Admit: 2023-11-03 | Discharge: 2023-11-03 | Disposition: A | Discharge status: Home / Self Care | Payer: BC Managed Care – PPO | Attending: Emergency Medicine | Admitting: Emergency Medicine

## 2023-11-03 DIAGNOSIS — S199XXA Unspecified injury of neck, initial encounter: Secondary | ICD-10-CM | POA: Diagnosis present

## 2023-11-03 DIAGNOSIS — Y92410 Unspecified street and highway as the place of occurrence of the external cause: Secondary | ICD-10-CM | POA: Diagnosis not present

## 2023-11-03 DIAGNOSIS — S39012A Strain of muscle, fascia and tendon of lower back, initial encounter: Secondary | ICD-10-CM | POA: Diagnosis not present

## 2023-11-03 DIAGNOSIS — S161XXA Strain of muscle, fascia and tendon at neck level, initial encounter: Secondary | ICD-10-CM | POA: Diagnosis not present

## 2023-11-03 DIAGNOSIS — M25512 Pain in left shoulder: Secondary | ICD-10-CM | POA: Insufficient documentation

## 2023-11-03 MED ORDER — METHOCARBAMOL 500 MG PO TABS: 500.0000 mg | ORAL_TABLET | Freq: Two times a day (BID) | ORAL | 0 refills | Status: AC
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
## 2023-11-03 NOTE — Discharge Instructions (Signed)
